# Patient Record
Sex: Female | Born: 1955 | Race: White | Hispanic: No | Marital: Single | Smoking: Former smoker
Health system: Southern US, Community
[De-identification: ages and names within clinical notes are randomized; demographics above are authoritative.]

## PROBLEM LIST (undated history)

## (undated) DIAGNOSIS — F32A Depression, unspecified: Secondary | ICD-10-CM

## (undated) DIAGNOSIS — F329 Major depressive disorder, single episode, unspecified: Secondary | ICD-10-CM

## (undated) HISTORY — PX: CHOLECYSTECTOMY: SHX55

## (undated) HISTORY — PX: ABDOMINAL HYSTERECTOMY: SHX81

---

## 2009-04-30 ENCOUNTER — Ambulatory Visit: Payer: Self-pay | Admitting: Family Medicine

## 2011-07-22 ENCOUNTER — Ambulatory Visit: Payer: Self-pay | Admitting: Family Medicine

## 2012-03-18 ENCOUNTER — Ambulatory Visit: Payer: Self-pay | Admitting: Family Medicine

## 2012-03-18 LAB — COMPREHENSIVE METABOLIC PANEL
Albumin: 3.7 g/dL (ref 3.4–5.0)
Anion Gap: 8 (ref 7–16)
Bilirubin,Total: 0.5 mg/dL (ref 0.2–1.0)
Calcium, Total: 9 mg/dL (ref 8.5–10.1)
Chloride: 103 mmol/L (ref 98–107)
Co2: 32 mmol/L (ref 21–32)
Creatinine: 0.85 mg/dL (ref 0.60–1.30)
EGFR (African American): >60
Potassium: 3.6 mmol/L (ref 3.5–5.1)
SGOT(AST): 15 U/L (ref 15–37)
SGPT (ALT): 18 U/L (ref 12–78)
Total Protein: 7.1 g/dL (ref 6.4–8.2)

## 2012-03-18 LAB — CBC WITH DIFFERENTIAL/PLATELET
Basophil #: 0 10*3/uL (ref 0.0–0.1)
Basophil %: 0.2 %
Eosinophil #: 0 10*3/uL (ref 0.0–0.7)
HGB: 14.1 g/dL (ref 12.0–16.0)
Lymphocyte #: 1.6 10*3/uL (ref 1.0–3.6)
Monocyte %: 8 %
Neutrophil #: 3 10*3/uL (ref 1.4–6.5)
Platelet: 190 10*3/uL (ref 150–440)
RDW: 13.1 % (ref 11.5–14.5)
WBC: 5 10*3/uL (ref 3.6–11.0)

## 2012-03-18 LAB — URINALYSIS, COMPLETE
Ketone: NEGATIVE
Nitrite: NEGATIVE
Specific Gravity: 1.015 (ref 1.003–1.030)

## 2012-03-18 LAB — LIPASE, BLOOD: Lipase: 209 U/L (ref 73–393)

## 2012-03-20 ENCOUNTER — Ambulatory Visit: Payer: Self-pay | Admitting: Family Medicine

## 2013-01-15 ENCOUNTER — Ambulatory Visit: Payer: Self-pay | Admitting: Gastroenterology

## 2014-05-30 ENCOUNTER — Other Ambulatory Visit: Payer: Self-pay | Admitting: Family Medicine

## 2014-05-30 DIAGNOSIS — Z1231 Encounter for screening mammogram for malignant neoplasm of breast: Secondary | ICD-10-CM

## 2014-06-04 ENCOUNTER — Ambulatory Visit
Admission: RE | Admit: 2014-06-04 | Discharge: 2014-06-04 | Disposition: A | Discharge status: Home / Self Care | Payer: BLUE CROSS/BLUE SHIELD | Source: Ambulatory Visit | Attending: Family Medicine | Admitting: Family Medicine

## 2014-06-04 DIAGNOSIS — Z1231 Encounter for screening mammogram for malignant neoplasm of breast: Secondary | ICD-10-CM | POA: Diagnosis not present

## 2014-06-28 IMAGING — CT CT ABDOMEN W/ CM
2 of 3 series · 13 of 32 positions shown, 19 images · IV contrast (isovue)
Comparison: none

REASON FOR EXAM: RUQ ABD PAIN CALL REPORT [id number]
COMMENTS:

PROCEDURE:     CT  - CT ABDOMEN STANDARD W  - March 18, 2012  [DATE]
RESULT:     Comparison: None
TECHNIQUE: Multiple axial images of the abdomen were performed from the lung
bases to the iliac crests, with p.o. contrast and with 100 mL of Isovue 300
intravenous contrast.

[Series 2: 3mm soft tissue · axial · 0.70mm/px · z∈[-906,-681]mm · 11 of 91 slices shown, 17 images]
[im 8/91  soft-tissue]
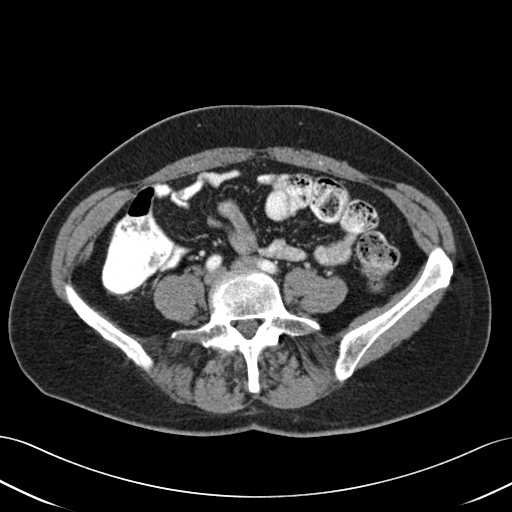
[im 8/91  bone]
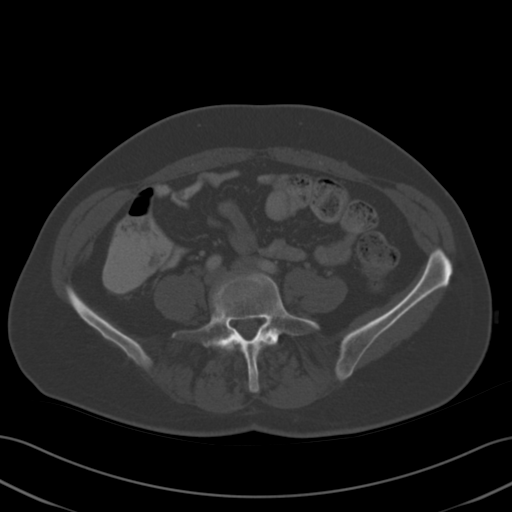
[im 16/91  soft-tissue]
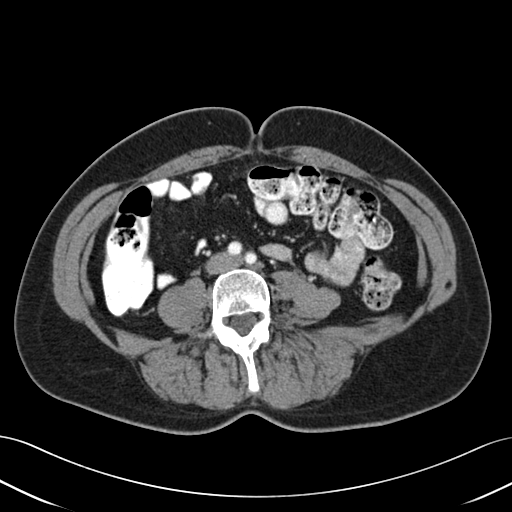
[im 23/91  soft-tissue]
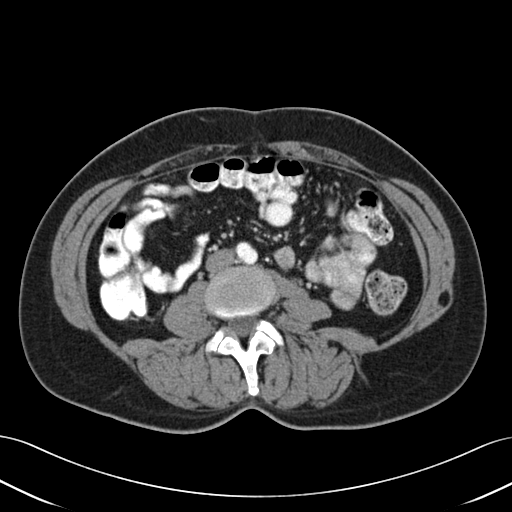
[im 31/91  soft-tissue]
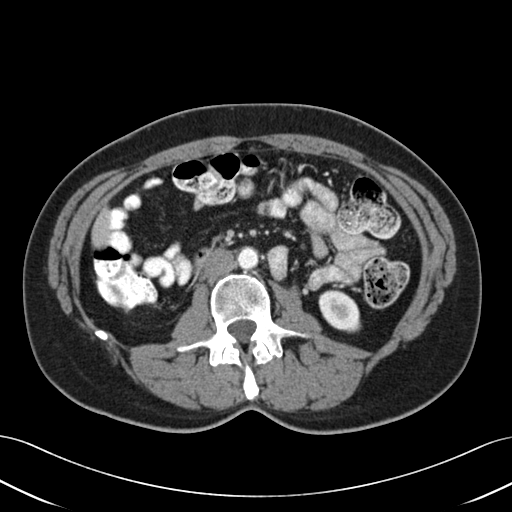
[im 38/91  soft-tissue]
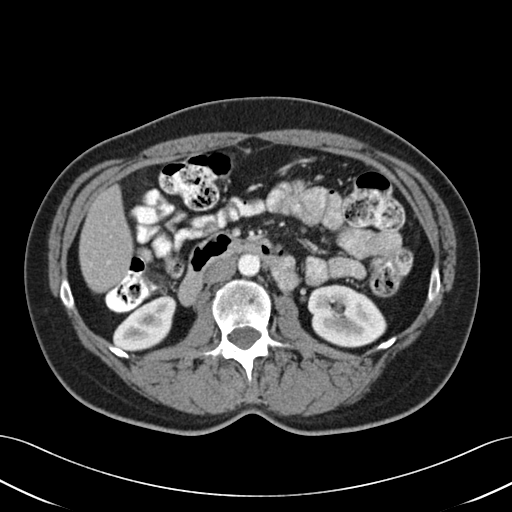
[im 46/91  soft-tissue]
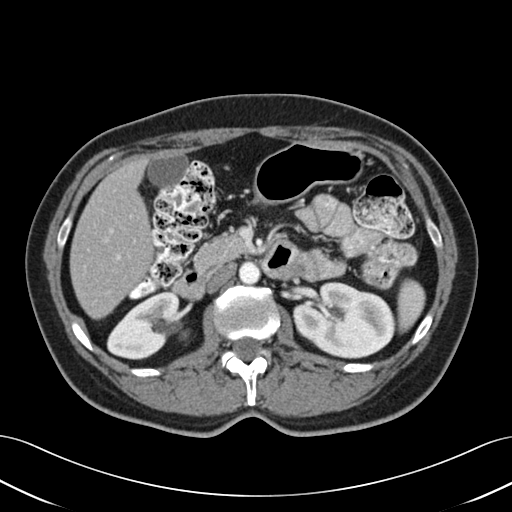
[im 53/91  soft-tissue]
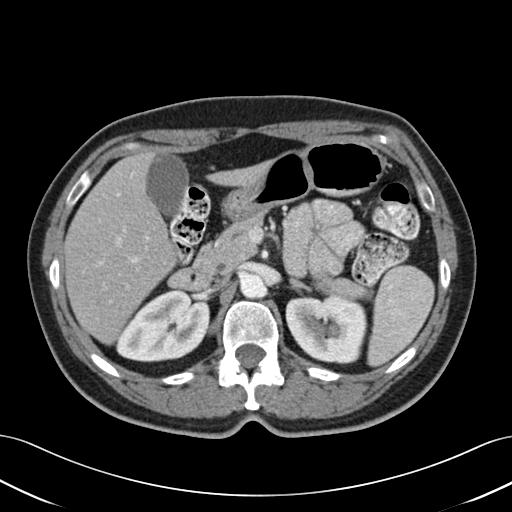
[im 61/91  soft-tissue]
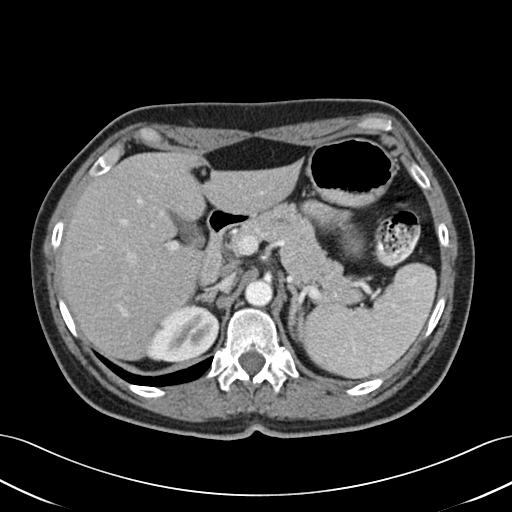
[im 61/91  lung]
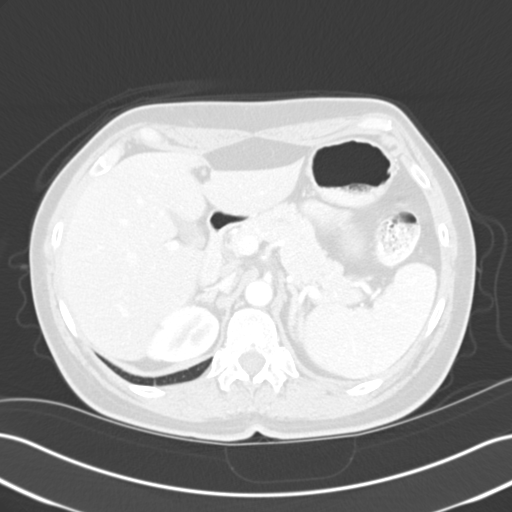
[im 68/91  soft-tissue]
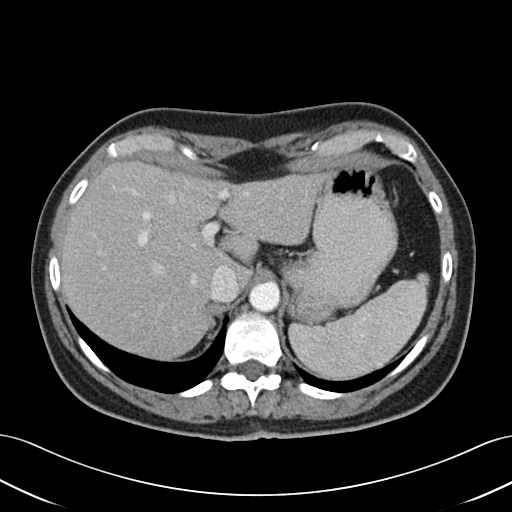
[im 68/91  lung]
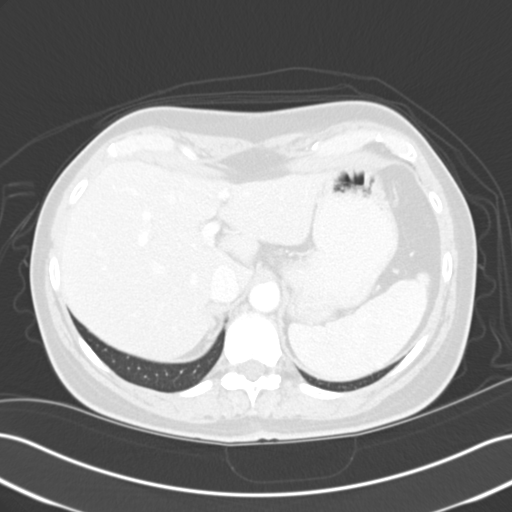
[im 68/91  bone]
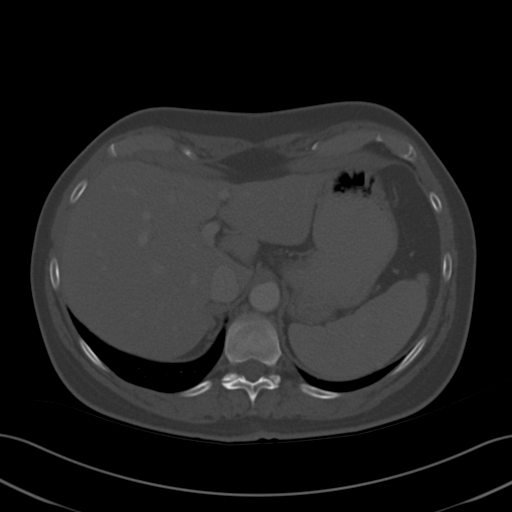
[im 76/91  soft-tissue]
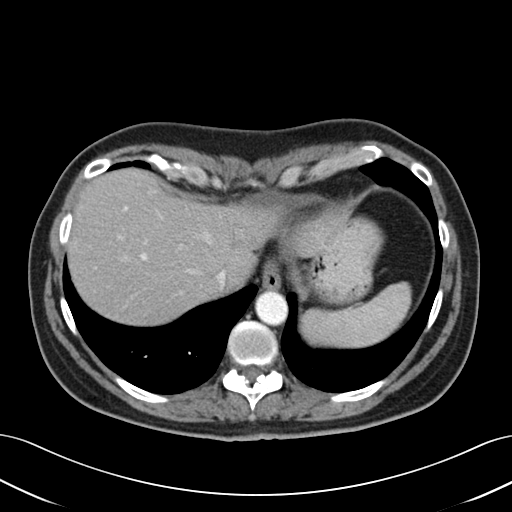
[im 76/91  lung]
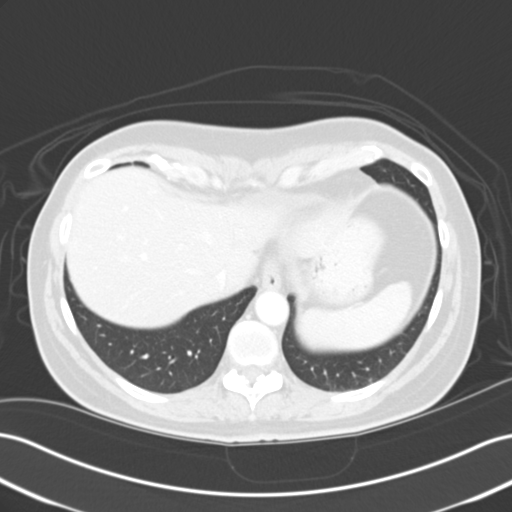
[im 83/91  soft-tissue]
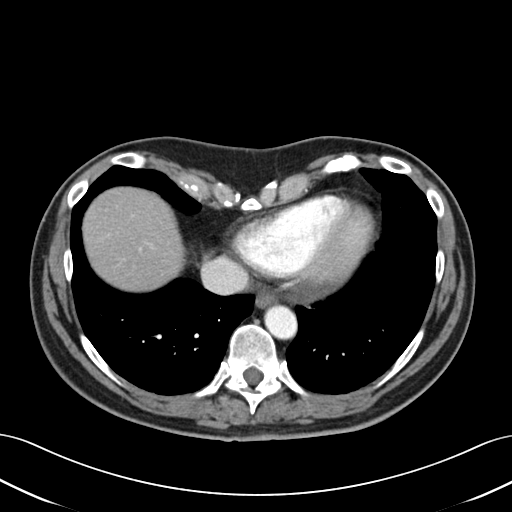
[im 83/91  lung]
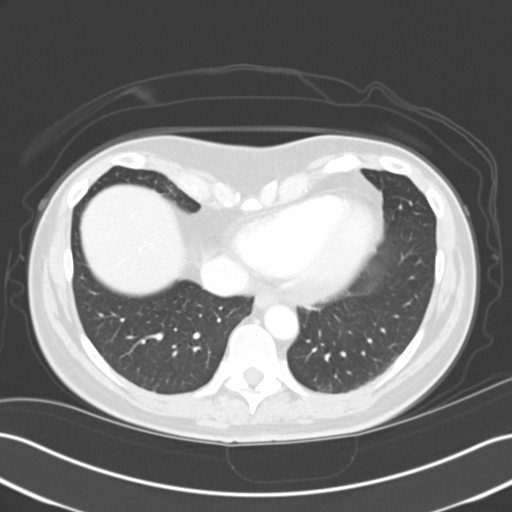

[Series 4: lung windows · axial · 0.70mm/px · z∈[-750,-726]mm · 2 of 39 slices shown]
[im 8/39  bone]
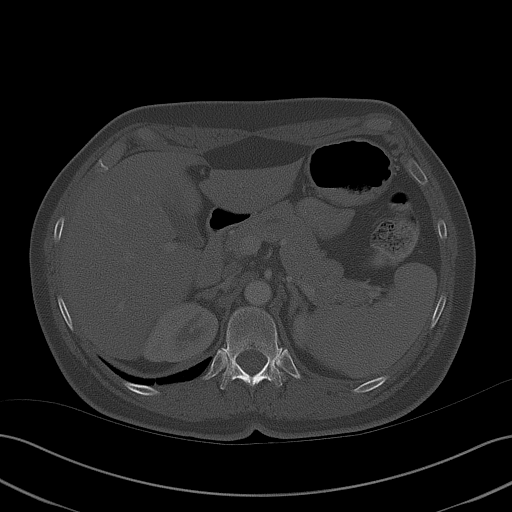
[im 16/39  bone]
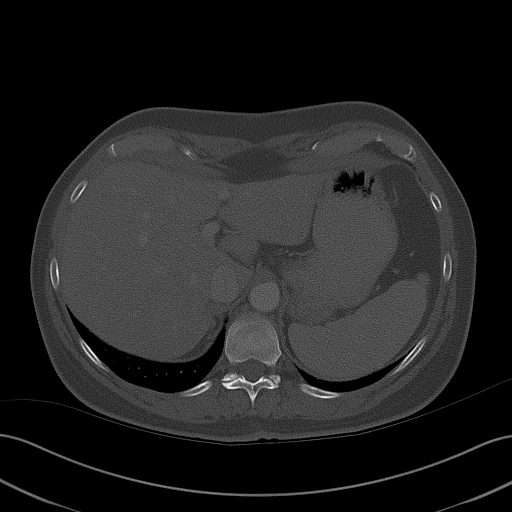

[13 of 32 positions shown; findings below may reference images not displayed]

FINDINGS: There is a pectus excavatum. A 4 mm nodule is seen in the left lower lobe
along the left hemidiaphragm.

Minimal low-attenuation along falciform ligament likely represents focal
fatty deposition. Small low-attenuation focus in the left hepatic lobe is
too small to characterize. The spleen, adrenals, and pancreas are
unremarkable. Small densities in the gallbladder may represent gallstones.
No pericholecystic stranding. Small low-attenuation foci in the right kidney
are too small to characterize.

The visualized small and large bowel are normal in caliber.

No aggressive lytic or sclerotic osseous lesions are identified.
IMPRESSION: 1. Possible cholelithiasis versus tiny gallbladder polyps. Further
evaluation could be provided with right upper quadrant ultrasound, as
indicated.
2. Indeterminate 4 mm subpleural nodule in the left lower lobe. If the
patient is at low risk for lung cancer, no further follow-up is recommended.
 If the patient is at high risk for lung cancer, recommend 12 month
follow-up noncontrast chest CT.

[REDACTED]

## 2014-06-30 IMAGING — US ABDOMEN ULTRASOUND LIMITED
2 series · 13 of 25 positions shown · non-contrast
Comparison: none

REASON FOR EXAM: CALL REPORT [id number] option 4 ATTN GALLBLADDER RUQ
abd pain
COMMENTS:

[Series 1: abdomen ultrasound limited · 0.21mm/px · 12 of 55 slices shown (1 of 2)]
[im 1/55]
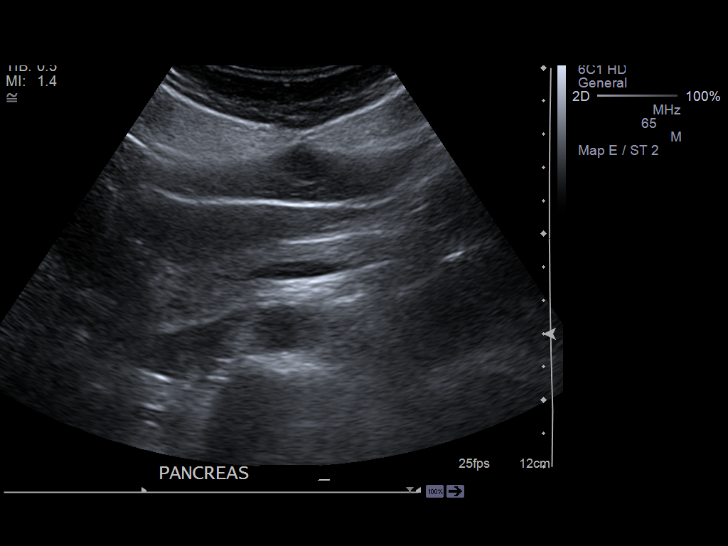
[im 5/55]
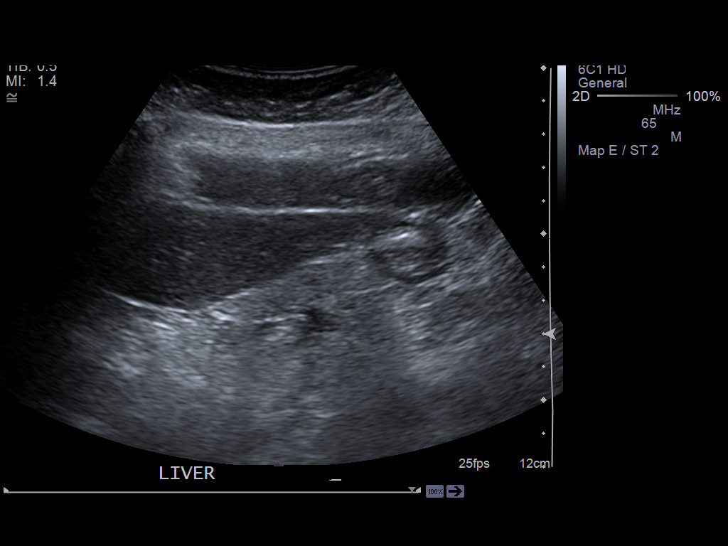
[im 10/55]
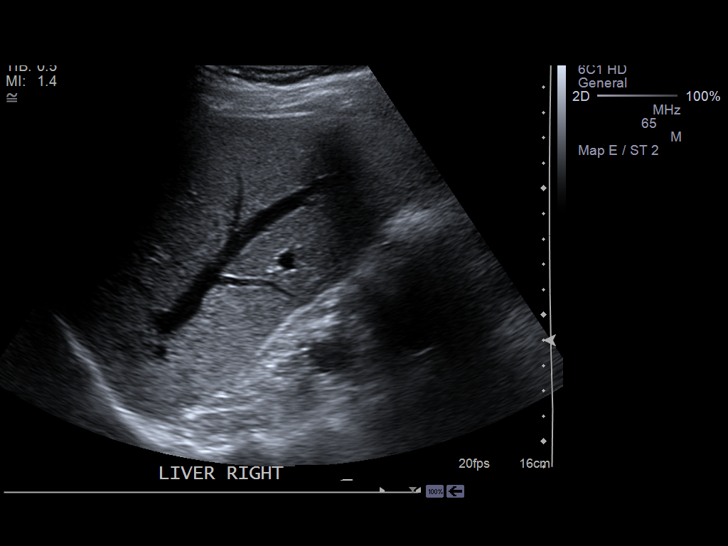
[im 15/55]
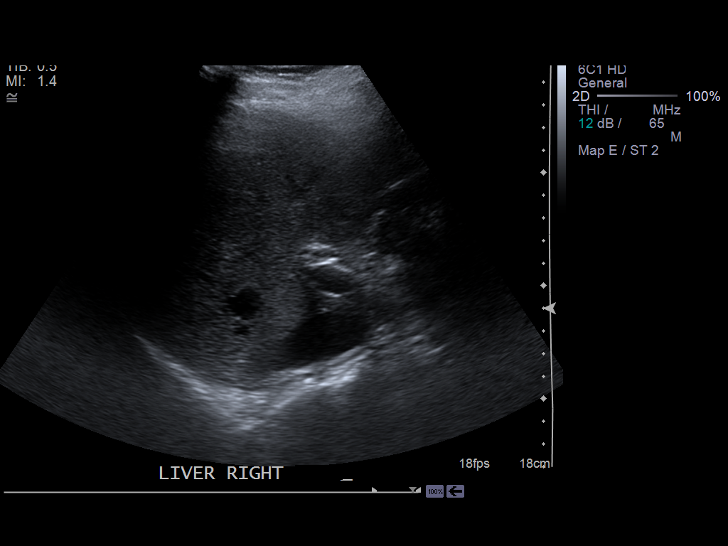
[im 19/55]
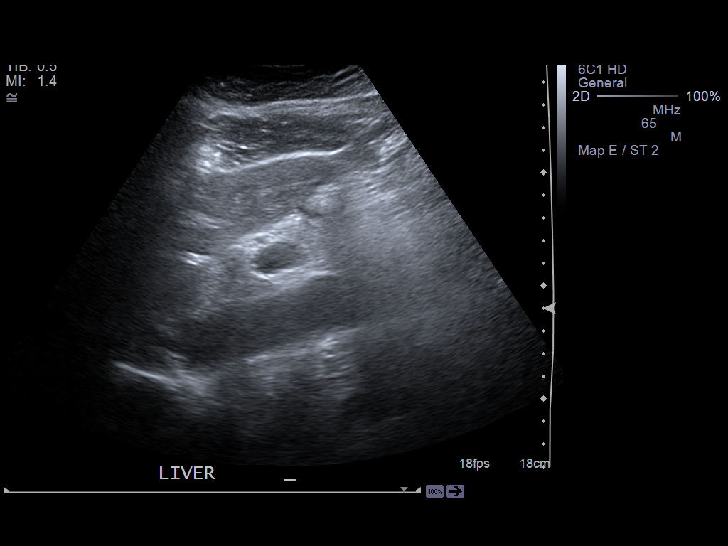
[im 24/55]
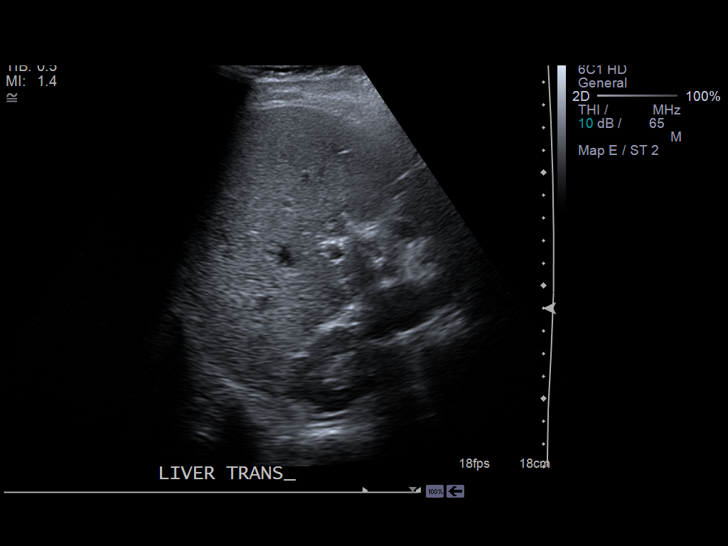
[im 29/55]
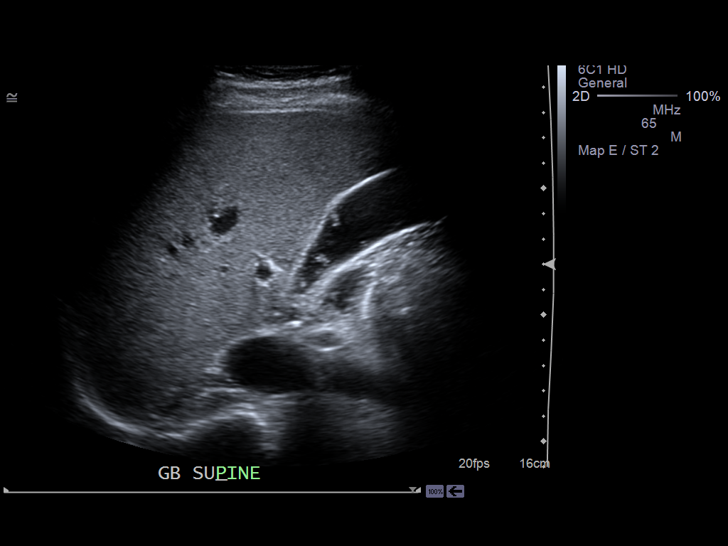
[im 33/55]
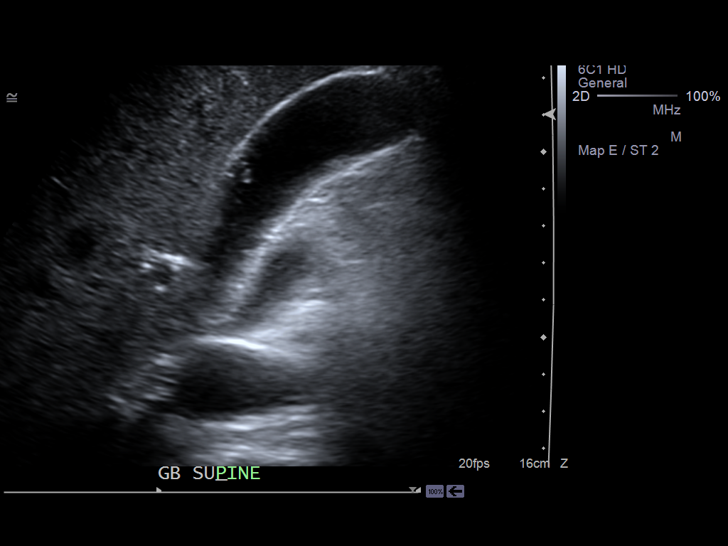
[im 38/55]
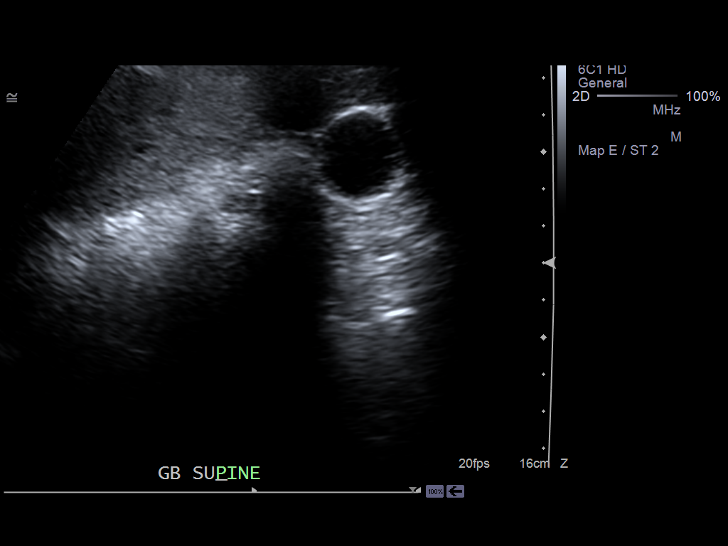
[im 43/55]
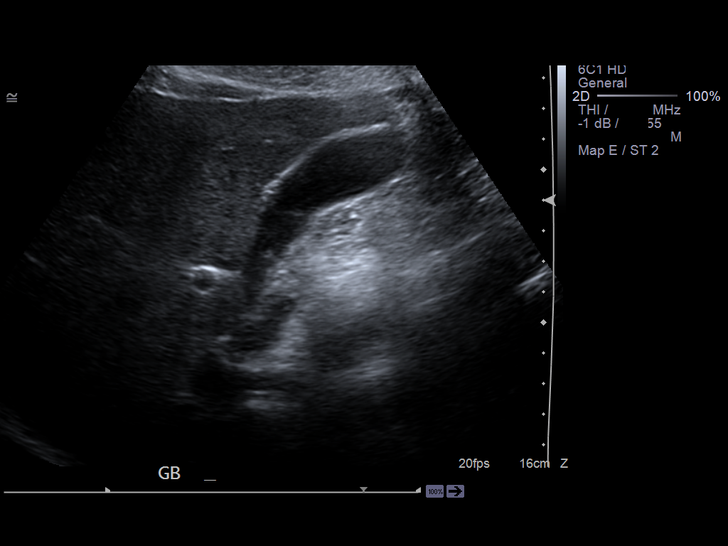
[im 47/55]
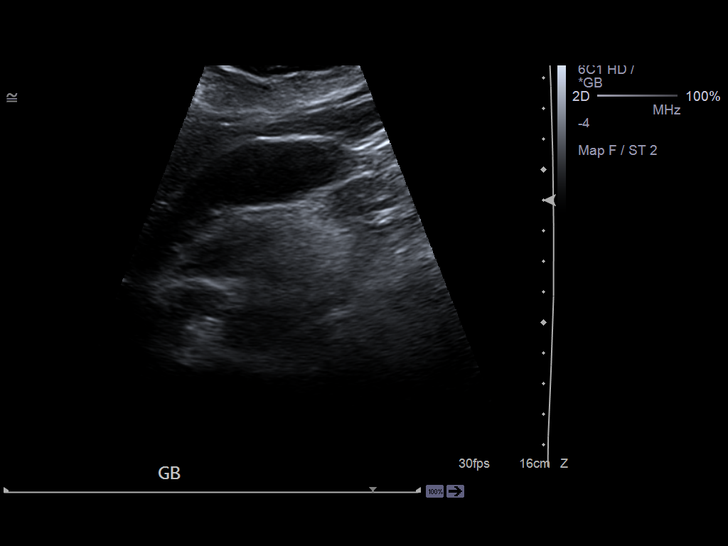
[im 52/55]
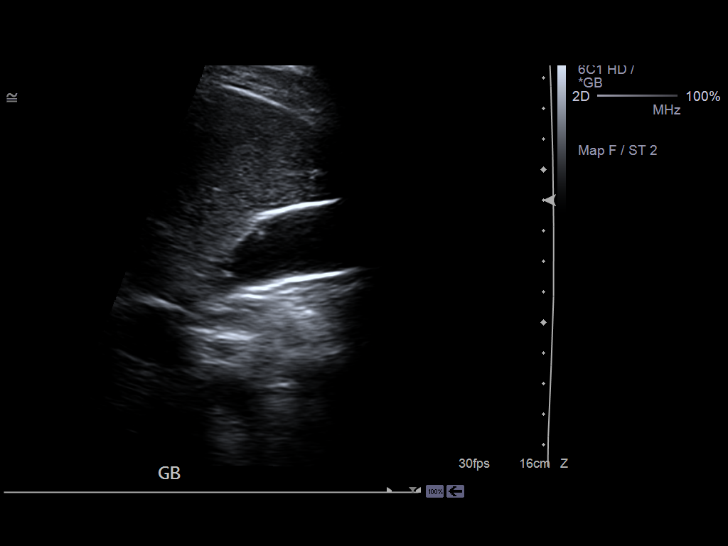

[Series 2001: abdomen ultrasound limited · 0.31mm/px · 1 of 3 slices shown (2 of 2)]
[im 1/3]
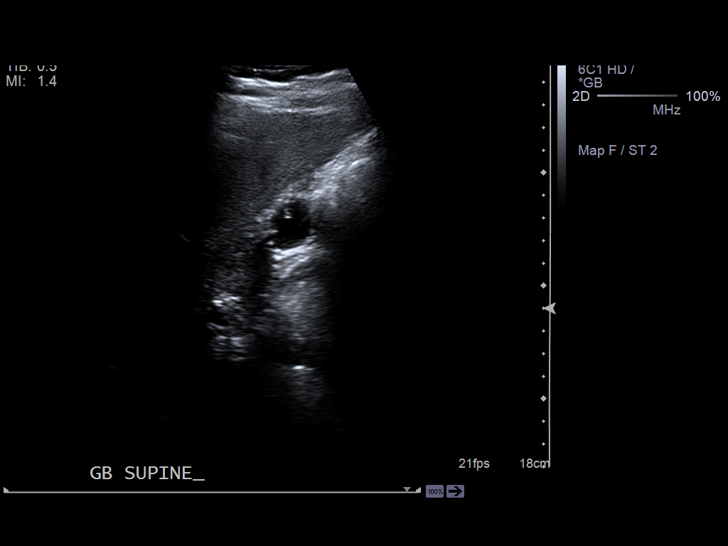

[13 of 25 positions shown; findings below may reference images not displayed]

PROCEDURE:     US  - US ABDOMEN LIMITED SURVEY  - March 20, 2012 [DATE]

RESULT:     A limited right upper quadrant ultrasound was performed.

The liver exhibits normal echotexture with no focal mass or ductal dilation.
Portal venous flow is normal in direction toward the liver. The gallbladder
is adequately distended. It contains echogenic nonmobile structures which do
not shadow most compatible with polyps or adherent nonshadowing stones. One
measures as much is 7 mm in diameter. There is no gallbladder wall
thickening or pericholecystic fluid. There is no positive sonographic
Murphy's sign. The common bile duct is normal at 3.1 mm in diameter. The
observed portions of the pancreas appear normal but bowel gas limited
evaluation of the pancreas.
IMPRESSION: 1. There are polyps versus nonmobile nonshadowing stones within the
gallbladder. There is no sonographic evidence of acute cholecystitis.
2. The liver and visualized portions of the pancreas appear normal.

[REDACTED]

## 2015-06-19 ENCOUNTER — Other Ambulatory Visit: Payer: Self-pay | Admitting: Family Medicine

## 2015-06-19 DIAGNOSIS — Z1231 Encounter for screening mammogram for malignant neoplasm of breast: Secondary | ICD-10-CM

## 2015-07-01 ENCOUNTER — Ambulatory Visit
Admission: RE | Admit: 2015-07-01 | Discharge: 2015-07-01 | Disposition: A | Discharge status: Home / Self Care | Payer: BLUE CROSS/BLUE SHIELD | Source: Ambulatory Visit | Attending: Family Medicine | Admitting: Family Medicine

## 2015-07-01 ENCOUNTER — Other Ambulatory Visit: Payer: Self-pay | Admitting: Family Medicine

## 2015-07-01 DIAGNOSIS — Z1231 Encounter for screening mammogram for malignant neoplasm of breast: Secondary | ICD-10-CM | POA: Diagnosis not present

## 2018-01-12 ENCOUNTER — Other Ambulatory Visit: Payer: Self-pay

## 2018-01-12 ENCOUNTER — Encounter: Payer: Self-pay | Admitting: Emergency Medicine

## 2018-01-12 ENCOUNTER — Ambulatory Visit
Admission: EM | Admit: 2018-01-12 | Discharge: 2018-01-12 | Disposition: A | Discharge status: Home / Self Care | Payer: No Typology Code available for payment source | Attending: Family Medicine | Admitting: Family Medicine

## 2018-01-12 DIAGNOSIS — J069 Acute upper respiratory infection, unspecified: Secondary | ICD-10-CM | POA: Diagnosis not present

## 2018-01-12 DIAGNOSIS — B9789 Other viral agents as the cause of diseases classified elsewhere: Secondary | ICD-10-CM | POA: Insufficient documentation

## 2018-01-12 HISTORY — DX: Depression, unspecified: F32.A

## 2018-01-12 HISTORY — DX: Major depressive disorder, single episode, unspecified: F32.9

## 2018-01-12 LAB — RAPID STREP SCREEN (MED CTR MEBANE ONLY): Streptococcus, Group A Screen (Direct): NEGATIVE

## 2018-01-12 MED ORDER — HYDROCODONE-HOMATROPINE 5-1.5 MG/5ML PO SYRP: 5.0000 mL | ORAL_SOLUTION | Freq: Four times a day (QID) | ORAL | 0 refills | Status: DC | PRN

## 2018-01-12 NOTE — Discharge Instructions (Signed)
Rest.  Fluids.  Cough medication as needed.  Take care  Dr. Adriana Simas

## 2018-01-12 NOTE — ED Provider Notes (Signed)
[facility]    CSN: [id number] Arrival date & time: 01/12/18  1306  History   Chief Complaint Chief Complaint  Patient presents with  . Cough  . Sore Throat   HPI  63 year old female presents with the above complaints.  Patient states she has been sick since Sunday.  She reports cough, sore throat, hoarseness.  No fever.  She states that she was exposed to someone with strep.  She is concerned about strep.  Sore throat is worse particular with coughing.  She has been taking ibuprofen and using albuterol without resolution.  Symptoms are moderate in severity.  No other associated symptoms.  No other complaints or concerns at this time.  PMH, Surgical Hx, Family Hx, Social History reviewed and updated as below.  Past Medical History:  Diagnosis Date  . Depression    Past Surgical History:  Procedure Laterality Date  . ABDOMINAL HYSTERECTOMY     OB History   No obstetric history on file.     Home Medications    Prior to Admission medications   Medication Sig Start Date End Date Taking? Authorizing Provider  venlafaxine XR (EFFEXOR-XR) 75 MG 24 hr capsule Take by mouth. 05/24/16  Yes [provider]  HYDROcodone-homatropine (HYCODAN) 5-1.5 MG/5ML syrup Take 5 mLs by mouth every 6 (six) hours as needed. 01/12/18   Tommie Samsook, Kynedi Profitt G, DO    Family History Family History  Problem Relation Age of Onset  . Breast cancer Maternal Grandmother 4860    Social History Social History   Tobacco Use  . Smoking status: Never Smoker  . Smokeless tobacco: Never Used  Substance Use Topics  . Alcohol use: Never    Frequency: Never  . Drug use: Never     Allergies   Patient has no known allergies.   Review of Systems Review of Systems  Constitutional: Negative for fever.  HENT: Positive for sore throat and voice change.   Respiratory: Positive for cough.    Physical Exam Triage Vital Signs ED Triage Vitals  Enc Vitals Group     BP 01/12/18 1320 119/68       Pulse Rate 01/12/18 1320 87     Resp 01/12/18 1320 18     Temp 01/12/18 1320 98.3 F (36.8 C)     Temp Source 01/12/18 1320 Oral     SpO2 01/12/18 1320 98 %     Weight 01/12/18 1318 152 lb (68.9 kg)     Height 01/12/18 1318 5\' 10"  (1.778 m)     Head Circumference --      Peak Flow --      Pain Score 01/12/18 1318 4     Pain Loc --      Pain Edu? --      Excl. in GC? --    Updated Vital Signs BP 119/68 (BP Location: Right Arm)   Pulse 87   Temp 98.3 F (36.8 C) (Oral)   Resp 18   Ht 5\' 10"  (1.778 m)   Wt 68.9 kg   SpO2 98%   BMI 21.81 kg/m   Visual Acuity Right Eye Distance:   Left Eye Distance:   Bilateral Distance:    Right Eye Near:   Left Eye Near:    Bilateral Near:     Physical Exam Vitals signs and nursing note reviewed.  Constitutional:      General: She is not in acute distress. HENT:     Head: Normocephalic and atraumatic.  Right Ear: Tympanic membrane normal.     Left Ear: Tympanic membrane normal.     Mouth/Throat:     Pharynx: Oropharynx is clear.     Comments: Mild erythema of the oropharynx. Eyes:     General:        Right eye: No discharge.        Left eye: No discharge.     Conjunctiva/sclera: Conjunctivae normal.  Cardiovascular:     Rate and Rhythm: Normal rate and regular rhythm.  Pulmonary:     Effort: Pulmonary effort is normal.     Breath sounds: No wheezing, rhonchi or rales.  Neurological:     Mental Status: She is alert.  Psychiatric:        Mood and Affect: Mood normal.        Behavior: Behavior normal.    UC Treatments / Results  Labs (all labs ordered are listed, but only abnormal results are displayed) Labs Reviewed  RAPID STREP SCREEN ([facility] ONLY)  CULTURE, GROUP A STREP [facility])    EKG None  Radiology No results found.  Procedures Procedures (including critical care time)  Medications Ordered in UC Medications - No data to display  Initial Impression / Assessment and Plan / UC Course  I  have reviewed the triage vital signs and the nursing notes.  Pertinent labs & imaging results that were available during my care of the patient were reviewed by me and considered in my medical decision making (see chart for details).    63 year old female presents with a viral URI with cough.  Strep negative.  Hycodan as needed for cough.  Supportive care.  Final Clinical Impressions(s) / UC Diagnoses   Final diagnoses:  Viral URI with cough     Discharge Instructions     Rest.  Fluids.  Cough medication as needed.  Take care  Dr. Adriana Simasook    ED Prescriptions    Medication Sig Dispense Auth. Provider   HYDROcodone-homatropine (HYCODAN) 5-1.5 MG/5ML syrup Take 5 mLs by mouth every 6 (six) hours as needed. 120 mL Tommie Samsook, Trendon Zaring G, DO     Controlled Substance Prescriptions [place] Controlled Substance Registry consulted? Not Applicable   Tommie SamsCook, Mahrosh Donnell G, DO 01/12/18 1527

## 2018-01-12 NOTE — ED Triage Notes (Signed)
Patient c/o cough, sore throat and congestion that started 6 days ago. Patient was exposed to someone who tested positive for strep. Patient has been taking Ibuprofen and Albuterol inhaler PRN.

## 2018-01-15 LAB — CULTURE, GROUP A STREP (THRC)

## 2019-07-21 ENCOUNTER — Other Ambulatory Visit: Payer: Self-pay

## 2019-07-21 ENCOUNTER — Ambulatory Visit
Admission: EM | Admit: 2019-07-21 | Discharge: 2019-07-21 | Disposition: A | Discharge status: Home / Self Care | Payer: No Typology Code available for payment source | Attending: Family Medicine | Admitting: Family Medicine

## 2019-07-21 ENCOUNTER — Encounter: Payer: Self-pay | Admitting: Emergency Medicine

## 2019-07-21 DIAGNOSIS — R1031 Right lower quadrant pain: Secondary | ICD-10-CM | POA: Diagnosis present

## 2019-07-21 DIAGNOSIS — R5383 Other fatigue: Secondary | ICD-10-CM | POA: Diagnosis not present

## 2019-07-21 DIAGNOSIS — R519 Headache, unspecified: Secondary | ICD-10-CM | POA: Insufficient documentation

## 2019-07-21 LAB — URINALYSIS, COMPLETE (UACMP) WITH MICROSCOPIC
Bacteria, UA: NONE SEEN
Glucose, UA: NEGATIVE mg/dL
Leukocytes,Ua: NEGATIVE
Nitrite: NEGATIVE
Protein, ur: 30 mg/dL — AB
Specific Gravity, Urine: >1.03 — ABNORMAL HIGH (ref 1.005–1.030)
pH: 5.5 (ref 5.0–8.0)

## 2019-07-21 LAB — GLUCOSE, CAPILLARY: Glucose-Capillary: 141 mg/dL — ABNORMAL HIGH (ref 70–99)

## 2019-07-21 NOTE — Discharge Instructions (Addendum)
Go directly to emergency room as discussed.  °

## 2019-07-21 NOTE — ED Provider Notes (Signed)
[facility] ____________________________________________  Time seen: Approximately 1:50 PM  I have reviewed the triage vital signs and the nursing notes.   HISTORY  Chief Complaint Fatigue, Headache, and Flank Pain   HPI Karina Peck is a 64 y.o. female presenting for evaluation of fatigue, headache, back pain, abdominal pain, fever.  Patient reports for the last 2 weeks she has been having diffuse fatigue.  States in the week she has been having intermittent severe headaches "feels like a vice ".  States headaches come well.  States also having right lower abdominal pain.  Reports she feels very thirsty though drinking plenty of fluids.  Some bilateral back pain.  Denies burning with urination.  Denies vomiting or atypical bowel changes.  Denies known sick contacts.  States she even went and had a Covid test performed which is negative.  No accompanying cough, congestion, sore throat or drainage.  States she has intermittently felt very hot and flushed, checked her temperature yesterday and it was 102.  Has taken intermittent Tylenol ibuprofen.  No over-the-counter medication taken today prior to arrival.  Patient reports that she has baseline recurrent epigastric discomfort, but reports some different abdominal discomfort than her normal baseline.  Also reports darkened urine color.  Leim Fabry, MD : PCP     Past Medical History:  Diagnosis Date  . Depression     There are no problems to display for this patient.   Past Surgical History:  Procedure Laterality Date  . ABDOMINAL HYSTERECTOMY    . CHOLECYSTECTOMY       No current facility-administered medications for this encounter.  Current Outpatient Medications:  .  venlafaxine XR (EFFEXOR-XR) 75 MG 24 hr capsule, Take by mouth., Disp: , Rfl:  .  HYDROcodone-homatropine (HYCODAN) 5-1.5 MG/5ML syrup, Take 5 mLs by mouth every 6 (six) hours as needed., Disp: 120 mL, Rfl: 0  Allergies Patient has no  known allergies.  Family History  Problem Relation Age of Onset  . Breast cancer Maternal Grandmother 60  . Bladder Cancer Mother   . Diverticulitis Mother   . Diabetes Father   . Alcoholism Father   . Diabetes Sister        pre-diabetic    Social History Social History   Tobacco Use  . Smoking status: Former Smoker    Packs/day: 1.00    Years: 5.00    Pack years: 5.00    Types: Cigarettes    Quit date: 07/21/1979    Years since quitting: 40.0  . Smokeless tobacco: Never Used  Vaping Use  . Vaping Use: Never used  Substance Use Topics  . Alcohol use: Never  . Drug use: Never    Review of Systems Constitutional: Positive fever Eyes: No visual changes. ENT: No sore throat. Cardiovascular: Denies chest pain. Respiratory: Denies shortness of breath. Gastrointestinal: Positive abdominal pain.  No nausea, no vomiting.  No diarrhea.  No constipation. Genitourinary: Negative for dysuria. Musculoskeletal: Negative for back pain. Skin: Negative for rash. Neurological: Negative for headaches, focal weakness or numbness.    ____________________________________________   PHYSICAL EXAM:  VITAL SIGNS: ED Triage Vitals  Enc Vitals Group     BP 07/21/19 1311 122/65     Pulse Rate 07/21/19 1311 94     Resp 07/21/19 1311 18     Temp 07/21/19 1311 98.1 F (36.7 C)     Temp Source 07/21/19 1311 Oral     SpO2 07/21/19 1311 100 %     Weight 07/21/19 1312  150 lb (68 kg)     Height 07/21/19 1312 5' 9.5" (1.765 m)     Head Circumference --      Peak Flow --      Pain Score 07/21/19 1311 6     Pain Loc --      Pain Edu? --      Excl. in GC? --     Constitutional: Alert and oriented. Well appearing and in no acute distress. Eyes: Conjunctivae are normal. PERRL. EOMI. ENT      Head: Normocephalic and atraumatic.  Hematological/Lymphatic/Immunilogical: No cervical lymphadenopathy. Cardiovascular: Normal rate, regular rhythm. Grossly normal heart sounds.  Good peripheral  circulation. Respiratory: Normal respiratory effort without tachypnea nor retractions. Breath sounds are clear and equal bilaterally. No wheezes, rales, rhonchi. Gastrointestinal: Normal Bowel sounds. No CVA tenderness.  Mild epigastric and right upper quadrant tenderness.  Mild to moderate right lower quadrant abdominal tenderness. Musculoskeletal: Steady gait.  No extremity edema noted.  No midline cervical, thoracic or lumbar tenderness palpation. Neurologic:  Normal speech and language. No gross focal neurologic deficits are appreciated. Speech is normal. No gait instability.  No meningismus. Skin:  Skin is warm, dry and intact. No rash noted. Psychiatric: Mood and affect are normal. Speech and behavior are normal. Patient exhibits appropriate insight and judgment   ___________________________________________   LABS (all labs ordered are listed, but only abnormal results are displayed)  Labs Reviewed  URINALYSIS, COMPLETE (UACMP) WITH MICROSCOPIC - Abnormal; Notable for the following components:      Result Value   Color, Urine AMBER (*)    Specific Gravity, Urine >1.030 (*)    Hgb urine dipstick TRACE (*)    Bilirubin Urine MODERATE (*)    Ketones, ur TRACE (*)    Protein, ur 30 (*)    All other components within normal limits  GLUCOSE, CAPILLARY - Abnormal; Notable for the following components:   Glucose-Capillary 141 (*)    All other components within normal limits  CBG MONITORING, ED   PROCEDURES Procedures    INITIAL IMPRESSION / ASSESSMENT AND PLAN / ED COURSE  Pertinent labs & imaging results that were available during my care of the patient were reviewed by me and considered in my medical decision making (see chart for details).  Patient with above complaints.  Intermittent fever.  Abdominal pain, headaches and fatigue.  Urinalysis as above, moderate bilirubin, ketones, protein.  Discussed multiple differentials with patient, recommend further evaluation in the  emergency room at this time.  Patient agrees to plan and states she will go to a [facility].  Patient stable at discharge.  ____________________________________________   FINAL CLINICAL IMPRESSION(S) / ED DIAGNOSES  Final diagnoses:  Fatigue, unspecified type  RLQ abdominal pain  Nonintractable headache, unspecified chronicity pattern, unspecified headache type     ED Discharge Orders    None       Note: This dictation was prepared with Dragon dictation along with smaller phrase technology. Any transcriptional errors that result from this process are unintentional.         Renford Dills, NP 07/21/19 1501

## 2019-07-21 NOTE — ED Triage Notes (Signed)
Patient in today c/o severe fatigue x 3 weeks, headache x 1 week, bilateral flank pain off 7 on x 4 days. Fever (102) yesterday. Patient also c/o increased thirst and urinary frequency x 1 week.

## 2021-02-24 ENCOUNTER — Encounter: Payer: Self-pay | Admitting: Emergency Medicine

## 2021-02-24 ENCOUNTER — Ambulatory Visit
Admission: EM | Admit: 2021-02-24 | Discharge: 2021-02-24 | Disposition: A | Discharge status: Home / Self Care | Payer: Medicare HMO | Attending: Internal Medicine | Admitting: Internal Medicine

## 2021-02-24 ENCOUNTER — Other Ambulatory Visit: Payer: Self-pay

## 2021-02-24 DIAGNOSIS — S43005A Unspecified dislocation of left shoulder joint, initial encounter: Secondary | ICD-10-CM

## 2021-02-24 MED ORDER — KETOROLAC TROMETHAMINE 60 MG/2ML IM SOLN
15.0000 mg | Freq: Once | INTRAMUSCULAR | Status: AC
  Administered 2021-02-24: 15 mg via INTRAMUSCULAR

## 2021-02-24 NOTE — Discharge Instructions (Addendum)
Patient is advised to go to the emergency department for further evaluation and treatment.

## 2021-02-24 NOTE — ED Triage Notes (Signed)
Pt states she fell about 20 minutes ago and fell right on her left shoulder. She has visible deformation of her left shoulder. Pt is tearful during triage.

## 2021-02-24 NOTE — ED Notes (Signed)
Patient is being discharged from the Urgent Care and sent to the Emergency Department via POV . Per Dr. Leonides Grills, patient is in need of higher level of care due to shoulder dislocation. Patient is aware and verbalizes understanding of plan of care.  Vitals:   02/24/21 1702  BP: 103/65  Pulse: 72  Resp: 18  Temp: 97.7 F (36.5 C)  SpO2: 100%

## 2021-02-27 NOTE — ED Provider Notes (Signed)
[facility]    CSN: [id number] Arrival date & time: 02/24/21  1648      History   Chief Complaint Chief Complaint  Patient presents with   Shoulder Pain    left   Fall    HPI Karina Peck is a 66 y.o. female comes to the urgent care with left shoulder pain which happened 20 minutes ago.  She sustained a mechanical fall on the left side of her body.  Following the fall the patient sustained deformity of the left shoulder associated with severe pain.  Pain is sharp and throbbing.  No known relieving factors.  Aggravated by palpation and movement of the left upper extremity.  No numbness or tingling in the left upper extremity.  No weakness in the left upper extremity.Marland Kitchen   HPI  Past Medical History:  Diagnosis Date   Depression     There are no problems to display for this patient.   Past Surgical History:  Procedure Laterality Date   ABDOMINAL HYSTERECTOMY     CHOLECYSTECTOMY      OB History   No obstetric history on file.      Home Medications    Prior to Admission medications   Medication Sig Start Date End Date Taking? Authorizing Provider  venlafaxine XR (EFFEXOR-XR) 75 MG 24 hr capsule Take by mouth. 05/24/16  Yes [provider]    Family History Family History  Problem Relation Age of Onset   Breast cancer Maternal Grandmother 32   Bladder Cancer Mother    Diverticulitis Mother    Diabetes Father    Alcoholism Father    Diabetes Sister        pre-diabetic    Social History Social History   Tobacco Use   Smoking status: Former    Packs/day: 1.00    Years: 5.00    Pack years: 5.00    Types: Cigarettes    Quit date: 07/21/1979    Years since quitting: 41.6   Smokeless tobacco: Never  Vaping Use   Vaping Use: Never used  Substance Use Topics   Alcohol use: Never   Drug use: Never     Allergies   Patient has no known allergies.   Review of Systems Review of Systems  Musculoskeletal:  Positive for arthralgias.  Negative for joint swelling, myalgias and neck pain.  Skin: Negative.   Neurological: Negative.     Physical Exam Triage Vital Signs ED Triage Vitals  Enc Vitals Group     BP 02/24/21 1702 103/65     Pulse Rate 02/24/21 1702 72     Resp 02/24/21 1702 18     Temp 02/24/21 1702 97.7 F (36.5 C)     Temp Source 02/24/21 1702 Oral     SpO2 02/24/21 1702 100 %     Weight 02/24/21 1659 149 lb 14.6 oz (68 kg)     Height 02/24/21 1659 5' 9.5" (1.765 m)     Head Circumference --      Peak Flow --      Pain Score 02/24/21 1659 10     Pain Loc --      Pain Edu? --      Excl. in GC? --    No data found.  Updated Vital Signs BP 103/65 (BP Location: Left Arm)    Pulse 72    Temp 97.7 F (36.5 C) (Oral)    Resp 18    Ht 5' 9.5" (1.765 m)  Wt 68 kg    SpO2 100%    BMI 21.82 kg/m   Visual Acuity Right Eye Distance:   Left Eye Distance:   Bilateral Distance:    Right Eye Near:   Left Eye Near:    Bilateral Near:     Physical Exam Vitals and nursing note reviewed.  Constitutional:      General: She is not in acute distress.    Appearance: She is not ill-appearing.  Cardiovascular:     Rate and Rhythm: Normal rate and regular rhythm.  Musculoskeletal:     Comments: Range of motion is limited secondary to pain.  Left shoulder deformity noted.  No bruising noted.  Patient has a good hand grip bilaterally.  Neurological:     Mental Status: She is alert.     UC Treatments / Results  Labs (all labs ordered are listed, but only abnormal results are displayed) Labs Reviewed - No data to display  EKG   Radiology No results found.  Procedures Procedures (including critical care time)  Medications Ordered in UC Medications  ketorolac (TORADOL) injection 15 mg (15 mg Intramuscular Given 02/24/21 1712)    Initial Impression / Assessment and Plan / UC Course  I have reviewed the triage vital signs and the nursing notes.  Pertinent labs & imaging results that were  available during my care of the patient were reviewed by me and considered in my medical decision making (see chart for details).     1.  Closed dislocation of left shoulder: Patient is advised to go to the emergency department for further management Toradol 30 mg IM x1 dose Patient agrees with plan of care. Final Clinical Impressions(s) / UC Diagnoses   Final diagnoses:  Closed dislocation of left shoulder, initial encounter     Discharge Instructions      Patient is advised to go to the emergency department for further evaluation and treatment.    ED Prescriptions   None    PDMP not reviewed this encounter.   Merrilee Jansky, MD 02/27/21 2234

## 2022-04-15 ENCOUNTER — Other Ambulatory Visit: Payer: Self-pay

## 2022-04-15 DIAGNOSIS — R42 Dizziness and giddiness: Secondary | ICD-10-CM

## 2022-04-15 DIAGNOSIS — H903 Sensorineural hearing loss, bilateral: Secondary | ICD-10-CM

## 2022-04-20 ENCOUNTER — Other Ambulatory Visit: Payer: Self-pay | Admitting: Unknown Physician Specialty

## 2022-04-20 DIAGNOSIS — R42 Dizziness and giddiness: Secondary | ICD-10-CM

## 2022-04-20 DIAGNOSIS — H903 Sensorineural hearing loss, bilateral: Secondary | ICD-10-CM

## 2022-04-26 ENCOUNTER — Other Ambulatory Visit: Payer: Self-pay | Admitting: Unknown Physician Specialty

## 2022-04-26 ENCOUNTER — Ambulatory Visit
Admission: RE | Admit: 2022-04-26 | Discharge: 2022-04-26 | Disposition: A | Discharge status: Home / Self Care | Payer: Medicare HMO | Source: Ambulatory Visit | Attending: Unknown Physician Specialty | Admitting: Unknown Physician Specialty

## 2022-04-26 DIAGNOSIS — H903 Sensorineural hearing loss, bilateral: Secondary | ICD-10-CM

## 2022-04-26 DIAGNOSIS — R42 Dizziness and giddiness: Secondary | ICD-10-CM

## 2022-04-26 MED ORDER — GADOPICLENOL 0.5 MMOL/ML IV SOLN
6.0000 mL | Freq: Once | INTRAVENOUS | Status: AC | PRN
  Administered 2022-04-26: 6 mL via INTRAVENOUS

## 2022-05-05 ENCOUNTER — Other Ambulatory Visit: Payer: Medicare HMO
